# Patient Record
Sex: Male | Born: 1975
Health system: Southern US, Community
[De-identification: ages and names within clinical notes are randomized; demographics above are authoritative.]

---

## 2008-11-16 ENCOUNTER — Ambulatory Visit: Payer: Self-pay | Admitting: Internal Medicine

## 2008-11-16 DIAGNOSIS — L738 Other specified follicular disorders: Secondary | ICD-10-CM

## 2008-11-16 DIAGNOSIS — L219 Seborrheic dermatitis, unspecified: Secondary | ICD-10-CM | POA: Insufficient documentation

## 2015-02-07 ENCOUNTER — Emergency Department (HOSPITAL_COMMUNITY)
Admission: EM | Admit: 2015-02-07 | Discharge: 2015-02-07 | Disposition: A | Discharge status: Home / Self Care | Payer: BLUE CROSS/BLUE SHIELD | Attending: Emergency Medicine | Admitting: Emergency Medicine

## 2015-02-07 ENCOUNTER — Emergency Department (HOSPITAL_COMMUNITY): Payer: BLUE CROSS/BLUE SHIELD

## 2015-02-07 ENCOUNTER — Encounter (HOSPITAL_COMMUNITY): Payer: Self-pay | Admitting: Emergency Medicine

## 2015-02-07 DIAGNOSIS — J039 Acute tonsillitis, unspecified: Secondary | ICD-10-CM | POA: Insufficient documentation

## 2015-02-07 DIAGNOSIS — J029 Acute pharyngitis, unspecified: Secondary | ICD-10-CM | POA: Diagnosis present

## 2015-02-07 LAB — CBC WITH DIFFERENTIAL/PLATELET
Basophils Absolute: 0 10*3/uL (ref 0.0–0.1)
Basophils Relative: 0 %
EOS ABS: 0 10*3/uL (ref 0.0–0.7)
Eosinophils Relative: 0 %
HEMATOCRIT: 45 % (ref 39.0–52.0)
HEMOGLOBIN: 15.8 g/dL (ref 13.0–17.0)
LYMPHS ABS: 1.4 10*3/uL (ref 0.7–4.0)
LYMPHS PCT: 19 %
MCH: 29.8 pg (ref 26.0–34.0)
MCHC: 35.1 g/dL (ref 30.0–36.0)
MCV: 84.9 fL (ref 78.0–100.0)
MONOS PCT: 13 %
Monocytes Absolute: 1 10*3/uL (ref 0.1–1.0)
NEUTROS ABS: 5 10*3/uL (ref 1.7–7.7)
NEUTROS PCT: 68 %
Platelets: 233 10*3/uL (ref 150–400)
RBC: 5.3 MIL/uL (ref 4.22–5.81)
RDW: 11.9 % (ref 11.5–15.5)
WBC: 7.3 10*3/uL (ref 4.0–10.5)

## 2015-02-07 LAB — BASIC METABOLIC PANEL
ANION GAP: 13 (ref 5–15)
BUN: 10 mg/dL (ref 6–20)
CHLORIDE: 100 mmol/L — AB (ref 101–111)
CO2: 25 mmol/L (ref 22–32)
Calcium: 8.7 mg/dL — ABNORMAL LOW (ref 8.9–10.3)
Creatinine, Ser: 0.88 mg/dL (ref 0.61–1.24)
GFR calc non Af Amer: >60 mL/min (ref >60)
Glucose, Bld: 104 mg/dL — ABNORMAL HIGH (ref 65–99)
POTASSIUM: 3.6 mmol/L (ref 3.5–5.1)
SODIUM: 138 mmol/L (ref 135–145)

## 2015-02-07 MED ORDER — IOHEXOL 300 MG/ML  SOLN
75.0000 mL | Freq: Once | INTRAMUSCULAR | Status: AC | PRN
  Administered 2015-02-07: 75 mL via INTRAVENOUS

## 2015-02-07 MED ORDER — DEXAMETHASONE SODIUM PHOSPHATE 10 MG/ML IJ SOLN
10.0000 mg | Freq: Once | INTRAMUSCULAR | Status: DC
Start: 2015-02-07 — End: 2015-02-07

## 2015-02-07 MED ORDER — CLINDAMYCIN HCL 150 MG PO CAPS: 300.0000 mg | ORAL_CAPSULE | Freq: Four times a day (QID) | ORAL | Status: DC

## 2015-02-07 MED ORDER — DEXAMETHASONE SODIUM PHOSPHATE 10 MG/ML IJ SOLN
10.0000 mg | Freq: Once | INTRAMUSCULAR | Status: AC
  Administered 2015-02-07: 10 mg via INTRAVENOUS
  Filled 2015-02-07: qty 1

## 2015-02-07 NOTE — ED Provider Notes (Signed)
CSN: [id number]     Arrival date & time 02/07/15  1027 History   First MD Initiated Contact with Patient 02/07/15 0601     Chief Complaint  Patient presents with  . Fever  . Sore Throat     (Consider location/radiation/quality/duration/timing/severity/associated sxs/prior Treatment) HPI Comments: Pt is a 39 yo male who presents to the ED with complaint of sore throat, onset 6 days. Pt reports he was initially seen at [facility] on Wednesday for sore throat and had negative strep and was prescribed penicillin. He reports he had no relief and then was seen at the [facility] [address] [facility] yesterday, negative strep, negative mono and was prescribed a z-pak. He notes he has had no relief since starting the second antibiotics and reports worsening pain and swelling. Endorses fever that has been relieved at home with tylenol and ibuprofen. Denies trismus, drooling, neck swelling. Denies headache, chills, nasal congestion, rhinorrhea, SOB, dyspnea, CP, abdominal pain, N/V/D, urinary sxs, numbness, tingling, weakness. Pt notes his voice become more muffled this morning.   Patient is a 39 y.o. male presenting with fever and pharyngitis.  Fever Associated symptoms: sore throat   Sore Throat Associated symptoms include a fever and a sore throat.    History reviewed. No pertinent past medical history. History reviewed. No pertinent past surgical history. No family history on file. Social History  Substance Use Topics  . Smoking status: Never Smoker   . Smokeless tobacco: None  . Alcohol Use: Yes     Comment: social    Review of Systems  Constitutional: Positive for fever.  HENT: Positive for sore throat and voice change.       Allergies  Review of patient's allergies indicates no known allergies.  Home Medications   Prior to Admission medications   Medication Sig Start Date End Date Taking? Authorizing Provider  acetaminophen (TYLENOL) 500 MG tablet Take 1,000 mg by mouth every 6 (six) hours as needed  for moderate pain.   Yes Historical Provider, MD  azithromycin (ZITHROMAX) 250 MG tablet Take 250-500 mg by mouth daily. Take 2 tablets the first day Take 1 tablet daily for 4 days 5 days total   Yes Historical Provider, MD  ibuprofen (ADVIL,MOTRIN) 200 MG tablet Take 400 mg by mouth every 6 (six) hours as needed for moderate pain.   Yes Historical Provider, MD   BP 152/98 mmHg  Pulse 115  Temp(Src) 99 F (37.2 C) (Oral)  Wt 210 lb (95.255 kg)  SpO2 100% Physical Exam  Constitutional: He is oriented to person, place, and time. He appears well-developed and well-nourished. No distress.  HENT:  Head: Normocephalic and atraumatic.  Right Ear: Tympanic membrane normal.  Left Ear: Tympanic membrane normal.  Nose: Nose normal. Right sinus exhibits no maxillary sinus tenderness and no frontal sinus tenderness. Left sinus exhibits no maxillary sinus tenderness and no frontal sinus tenderness.  Mouth/Throat: Uvula is midline and mucous membranes are normal.  Bilateral swelling of tonsils with white/black exudate present on medial aspects of tonsils. Voice muffled during exam.  No trismus. No drooling.   Eyes: Conjunctivae and EOM are normal. Pupils are equal, round, and reactive to light. Right eye exhibits no discharge. Left eye exhibits no discharge. No scleral icterus.  Neck: Normal range of motion. Neck supple.  Mild swelling noted at submandibular region.   Cardiovascular: Normal rate, regular rhythm, normal heart sounds and intact distal pulses.   Pulmonary/Chest: Effort normal and breath sounds normal. No stridor. No respiratory distress. He has no  wheezes. He has no rales. He exhibits no tenderness.  Abdominal: Soft. Bowel sounds are normal. He exhibits no mass. There is no tenderness. There is no rebound and no guarding.  Musculoskeletal: He exhibits no edema.  Lymphadenopathy:    He has no cervical adenopathy.  Neurological: He is alert and oriented to person, place, and time.  Skin:  Skin is warm and dry.  Nursing note and vitals reviewed.   ED Course  Procedures (including critical care time) Labs Review Labs Reviewed - No data to display  Imaging Review No results found. I have personally reviewed and evaluated these images and lab results as part of my medical decision-making.   MDM   Final diagnoses:  Tonsillitis    Pt presents with worsening sore throat and resolved fever. No improvement with penicillin or azithromycin. VSS. Bilateral swelling or tonsils with exudate present on exam, no respiratory distress, no trismus, no drooling. CT neck revealed tonsillitis, right peritonsillar phlegmon or possibly early abscess, mild to moderate narrowing of the airway in the pharynx as a result, of concentric tonsillar hypertrophy, which is worse on the right. CBC and CMP unremarkable. Plan to d/c pt home with clindamycin and pt given ENT follow up. Advised to see ENT in the next 2-3 days. Pt given return precautions and advised to return to the ED if sxs worsen in the next 24 hours.   Evaluation does not show pathology requring ongoing emergent intervention or admission. Pt is hemodynamically stable and mentating appropriately. Discussed findings/results and plan with patient/guardian, who agrees with plan. All questions answered.      Satira Sark Kellogg, [place] 02/07/15 1626  Geoffery Lyons, MD 02/09/15 2138

## 2015-02-07 NOTE — ED Notes (Addendum)
MD Delo made aware of patient.

## 2015-02-07 NOTE — ED Notes (Addendum)
Pt from home c/o sore throat and fever since Tuesday. [facility] and had negative strep screen and was given penicillin. No relief. Pt went to [place] [facility] and was tested for strep once again and for mono and both were negative, he was prescribed a z pak and he has experienced no relief. Pt has been alternating tylenol and motrin. Last tylenol was 4 hours ago. Throat  Is swollen, red, and appears to resemble peritonsillar abcess.

## 2015-02-07 NOTE — Discharge Instructions (Signed)
Please take your prescriptions as prescribed. Please follow up with ENT in the next 2-3 days. Follow up with your primary care provider this week. Please return to the Emergency Department if symptoms worsen in the next 24 hours or new onset of difficulty swallowing, fever, drooling, neck swelling.

## 2015-09-14 DIAGNOSIS — M545 Low back pain: Secondary | ICD-10-CM | POA: Diagnosis not present

## 2015-09-27 DIAGNOSIS — M545 Low back pain: Secondary | ICD-10-CM | POA: Diagnosis not present

## 2016-05-04 DIAGNOSIS — J029 Acute pharyngitis, unspecified: Secondary | ICD-10-CM | POA: Diagnosis not present

## 2016-05-11 DIAGNOSIS — J069 Acute upper respiratory infection, unspecified: Secondary | ICD-10-CM | POA: Diagnosis not present

## 2016-05-11 DIAGNOSIS — J029 Acute pharyngitis, unspecified: Secondary | ICD-10-CM | POA: Diagnosis not present

## 2016-09-21 ENCOUNTER — Encounter: Payer: Self-pay | Admitting: Physician Assistant

## 2016-09-21 ENCOUNTER — Ambulatory Visit (INDEPENDENT_AMBULATORY_CARE_PROVIDER_SITE_OTHER): Payer: BC Managed Care – PPO | Admitting: Physician Assistant

## 2016-09-21 VITALS — BP 128/78 | HR 65 | Temp 99.0°F | Resp 16 | Ht 72.0 in | Wt 225.0 lb

## 2016-09-21 DIAGNOSIS — H109 Unspecified conjunctivitis: Secondary | ICD-10-CM | POA: Diagnosis not present

## 2016-09-21 MED ORDER — GENTAMICIN SULFATE 0.3 % OP SOLN: 1.0000 [drp] | OPHTHALMIC | 0 refills | Status: AC

## 2016-09-21 NOTE — Patient Instructions (Addendum)
     IF you received an x-ray today, you will receive an invoice from [facility]. Please contact [facility] at [phone] with questions or concerns regarding your invoice.   IF you received labwork today, you will receive an invoice from [facility]. Please contact [facility] at [phone] with questions or concerns regarding your invoice.   Our billing staff will not be able to assist you with questions regarding bills from these companies.  You will be contacted with the lab results as soon as they are available. The fastest way to get your results is to activate your My Chart account. Instructions are located on the last page of this paperwork. If you have not heard from us regarding the results in 2 weeks, please contact this office.     Bacterial Conjunctivitis Bacterial conjunctivitis is an infection of your conjunctiva. This is the clear membrane that covers the white part of your eye and the inner surface of your eyelid. This condition can make your eye:  Red or pink.  Itchy. This condition is caused by bacteria. This condition spreads very easily from person to person (is contagious) and from one eye to the other eye. Follow these instructions at home: Medicines   Take or apply your antibiotic medicine as told by your doctor. Do not stop taking or applying the antibiotic even if you start to feel better.  Take or apply over-the-counter and prescription medicines only as told by your doctor.  Do not touch your eyelid with the eye drop bottle or the ointment tube. Managing discomfort   Wipe any fluid from your eye with a warm, wet washcloth or a cotton ball.  Place a cool, clean washcloth on your eye. Do this for 10-20 minutes, 3-4 times per day. General instructions   Do not wear contact lenses until the irritation is gone. Wear glasses until your doctor says it is okay to wear contacts.  Do not wear eye makeup until your symptoms are gone. Throw away  any old makeup.  Change or wash your pillowcase every day.  Do not share towels or washcloths with anyone.  Wash your hands often with soap and water. Use paper towels to dry your hands.  Do not touch or rub your eyes.  Do not drive or use heavy machinery if your vision is blurry. Contact a doctor if:  You have a fever.  Your symptoms do not get better after 10 days. Get help right away if:  You have a fever and your symptoms suddenly get worse.  You have very bad pain when you move your eye.  Your face:  Hurts.  Is red.  Is swollen.  You have sudden loss of vision. This information is not intended to replace advice given to you by your health care provider. Make sure you discuss any questions you have with your health care provider. Document Released: 02/15/2008 Document Revised: 10/14/2015 Document Reviewed: 02/18/2015 Elsevier Interactive Patient Education  2017 Elsevier Inc.  

## 2016-09-21 NOTE — Progress Notes (Signed)
   Benjamin Cross  MRN: [id number] DOB: 11/10/1975  PCP: Sanda Lingerhomas Jones, MD  Chief Complaint  Patient presents with  . Conjunctivitis    Right eye redness/drainage     Subjective:  Pt presents to clinic for red right eye.  He has significant eye allergies in the past.  He has been having trouble with this eye and then last night and this am the right eye seemed to get worse different than normal with his allergies.  No recent cold  No sick contacts at home or work.    Review of Systems  Constitutional: Negative for chills and fever.  HENT: Negative.   Eyes: Positive for photophobia, discharge (eye matting), redness (right) and itching (prior). Negative for visual disturbance.  Allergic/Immunologic: Positive for environmental allergies.    Patient Active Problem List   Diagnosis Date Noted  . UNSPECIFIED SEBORRHEIC DERMATITIS 11/16/2008  . ASTEATOTIC ECZEMA 11/16/2008    No current outpatient prescriptions on file prior to visit.   No current facility-administered medications on file prior to visit.     No Known Allergies  Pt patients past, family and social history were reviewed and updated.   Objective:  BP 128/78   Pulse 65   Temp 99 F (37.2 C) (Oral)   Resp 16   Ht 6' (1.829 m)   Wt 225 lb (102.1 kg)   SpO2 96%   BMI 30.52 kg/m   Physical Exam  Constitutional: He is oriented to person, place, and time and well-developed, well-nourished, and in no distress.  HENT:  Head: Normocephalic and atraumatic.  Right Ear: External ear normal.  Left Ear: External ear normal.  Eyes: EOM are normal. Pupils are equal, round, and reactive to light. Right conjunctiva is injected. Left conjunctiva is not injected.  Sclera erythematous and swollen - no orbital erythema or swelling - no globe TTP  Neck: Normal range of motion.  Pulmonary/Chest: Effort normal.  Neurological: He is alert and oriented to person, place, and time. Gait normal.  Skin: Skin is warm and dry.    Psychiatric: Mood, memory, affect and judgment normal.    Assessment and Plan :  Bacterial conjunctivitis of right eye - Plan: gentamicin (GARAMYCIN) 0.3 % ophthalmic solution  Benny LennertSarah Adriana Lina PA-C  [facility] 09/21/2016 8:32 AM

## 2016-12-29 IMAGING — CT CT NECK W/ CM
4 of 5 series · 16 of 33 positions shown, 18 images · IV contrast (OMNIPAQUE 300)
Comparison: None.

CLINICAL DATA: Sore throat for 6 days, concern for peritonsillar
abscess, white blood count

EXAM:
CT NECK WITH CONTRAST
TECHNIQUE: Multidetector CT imaging of the neck was performed using the
standard protocol following the bolus administration of intravenous
contrast.
CONTRAST:  75mL OMNIPAQUE IOHEXOL 300 MG/ML  SOLN

[Series 3: neck with st · axial · 0.39mm/px · z∈[-272,-128]mm · 4 of 122 slices shown, 5 images]
[im 25/122  soft-tissue]
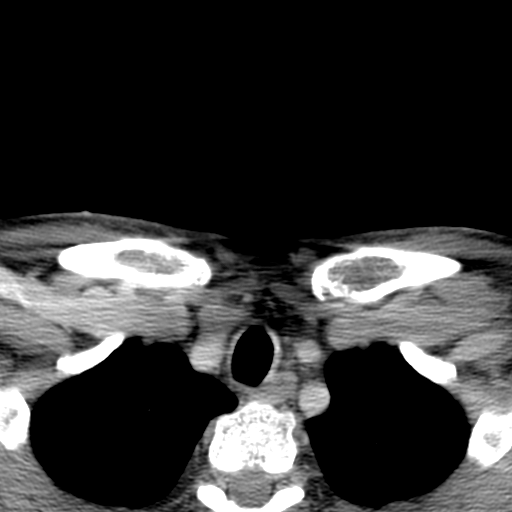
[im 25/122  bone]
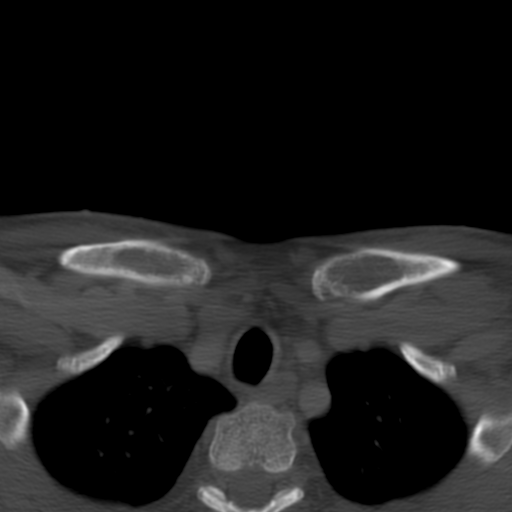
[im 49/122  bone]
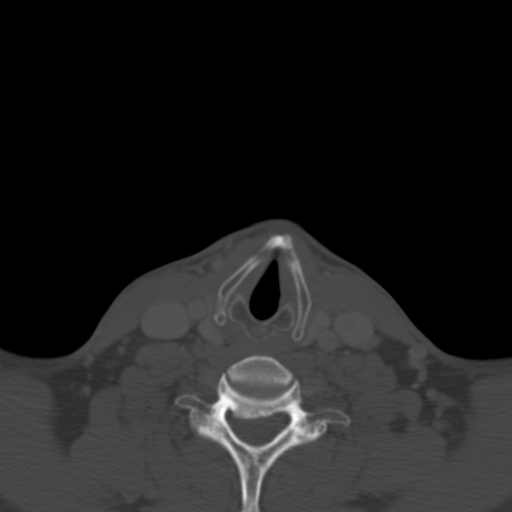
[im 73/122  bone]
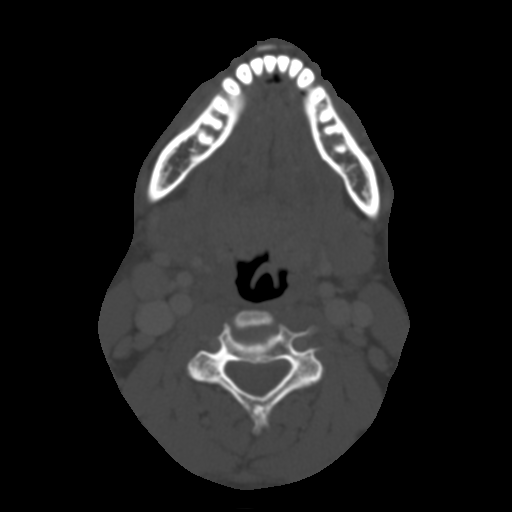
[im 97/122  bone]
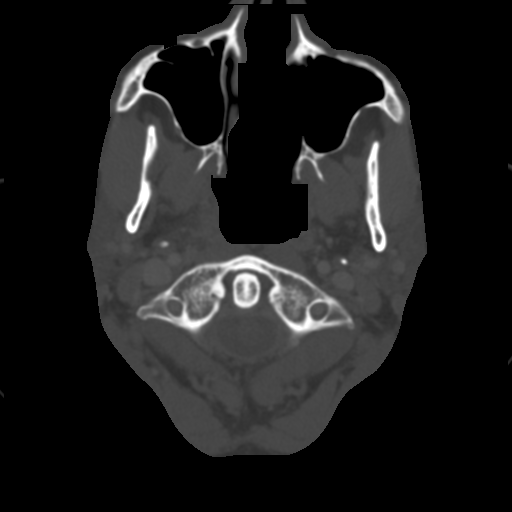

[Series 6: coronal st · coronal · 0.53mm/px · 3 of 96 slices shown]
[im 29/96  bone]
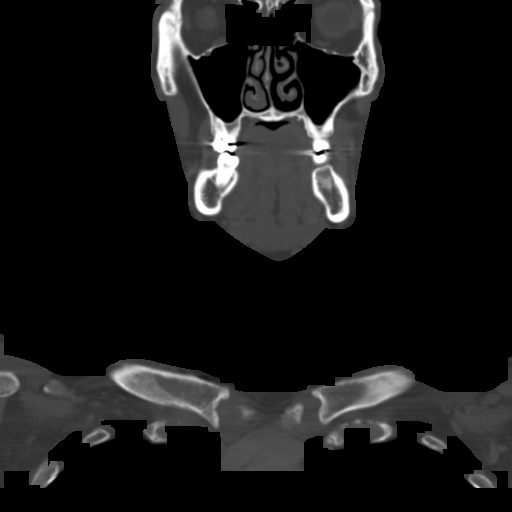
[im 42/96  bone]
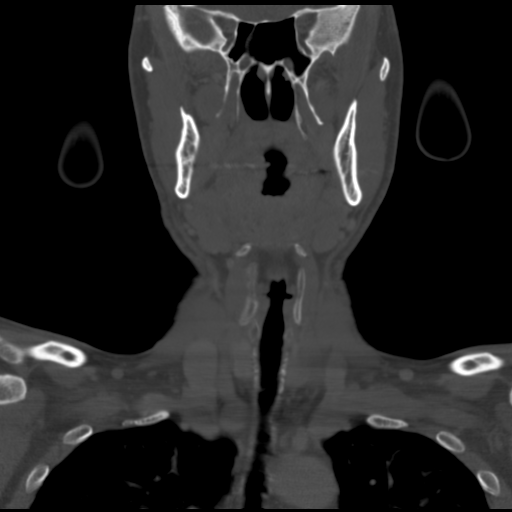
[im 54/96  bone]
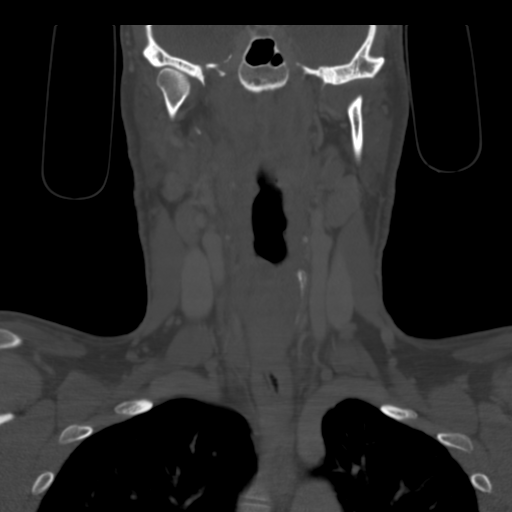

[Series 7: sagittal st · sagittal · 0.48mm/px · 5 of 101 slices shown, 6 images]
[im 34/101  bone]
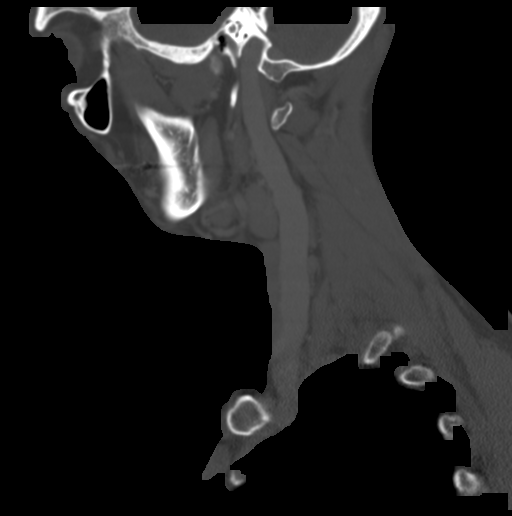
[im 42/101  bone]
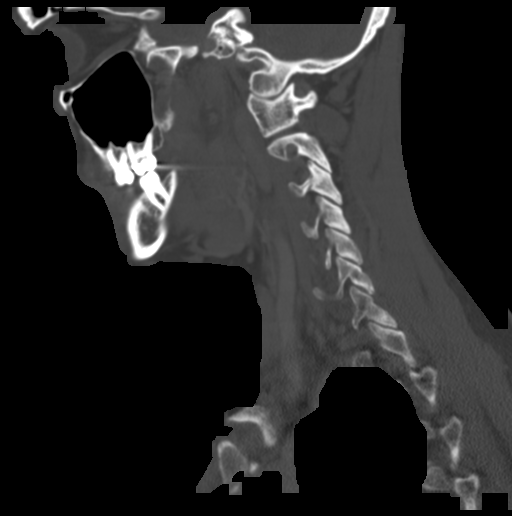
[im 51/101  soft-tissue]
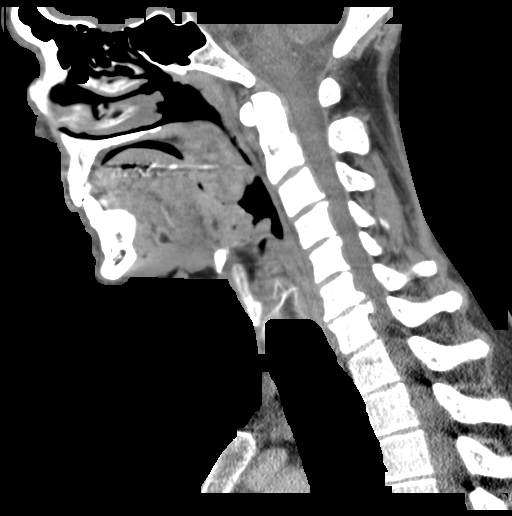
[im 51/101  bone]
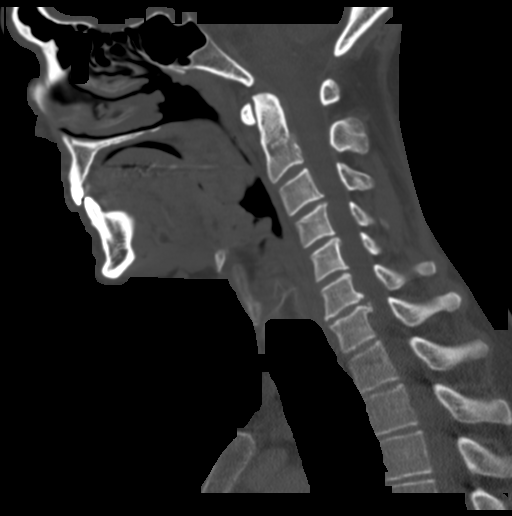
[im 59/101  bone]
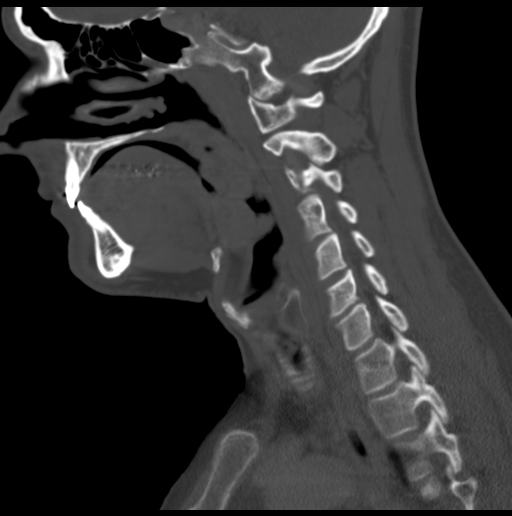
[im 67/101  bone]
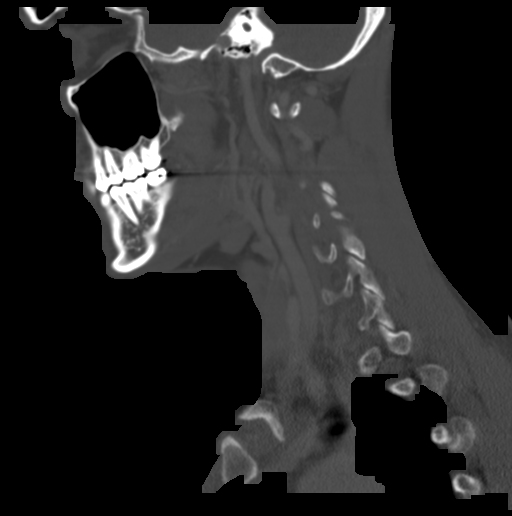

[Series 8: axial recons · axial · 0.39mm/px · z∈[-305,-157]mm · 4 of 134 slices shown]
[im 27/134  bone]
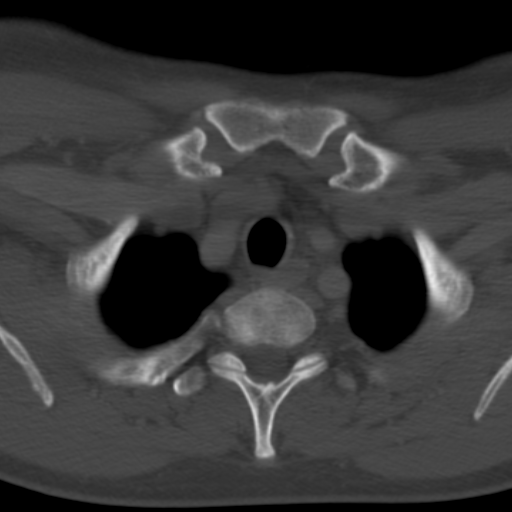
[im 54/134  bone]
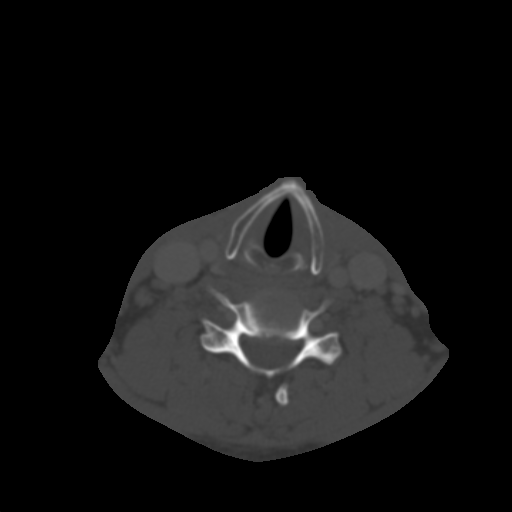
[im 80/134  bone]
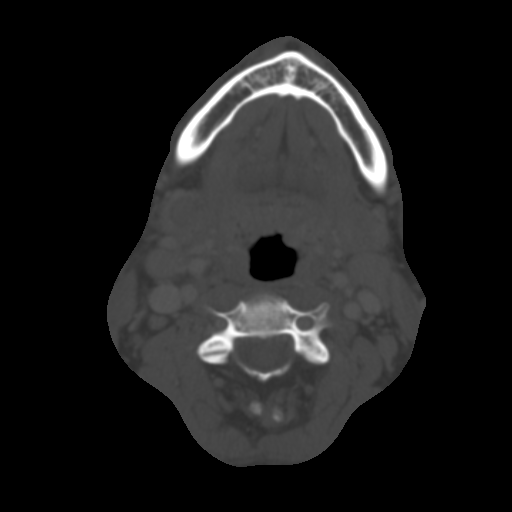
[im 107/134  bone]
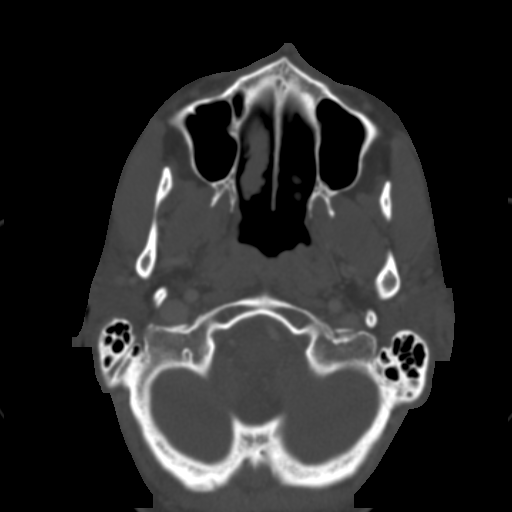

[16 of 33 positions shown; findings below may reference images not displayed]

FINDINGS: Pharynx and larynx: The parapharyngeal tonsillar tissue is
hypertrophy bilaterally. On the right, there is an area of subtle
low attenuation with subtle peripheral enhancement, measuring 18 x
11 mm. Soft tissue swelling is more severe on the right than on the
left and causes mass effect on the airway on the right side. There
is overall mild to moderate narrowing of the airway.

Salivary glands: Nor

Thyroid: Normal

Lymph nodes: Diffuse adenopathy throughout the neck presumably
reactive

Vascular: Normal

Limited intracranial: Normal

Visualized orbits: Normal

Mastoids and visualized paranasal sinuses: Normal

Skeleton: Normal

Upper chest: Normal
IMPRESSION: Tonsillitis. Right peritonsillar phlegmon or possibly early abscess.
Mild to moderate narrowing of the airway in the pharynx as a result
of concentric tonsillar hypertrophy, which is worse on the right.

## 2017-09-06 ENCOUNTER — Other Ambulatory Visit: Payer: Self-pay

## 2017-09-06 ENCOUNTER — Encounter: Payer: Self-pay | Admitting: Emergency Medicine

## 2017-09-06 ENCOUNTER — Ambulatory Visit (INDEPENDENT_AMBULATORY_CARE_PROVIDER_SITE_OTHER): Payer: BLUE CROSS/BLUE SHIELD | Admitting: Emergency Medicine

## 2017-09-06 VITALS — BP 114/71 | HR 55 | Temp 98.6°F | Resp 16 | Ht 71.25 in | Wt 194.4 lb

## 2017-09-06 DIAGNOSIS — J3489 Other specified disorders of nose and nasal sinuses: Secondary | ICD-10-CM | POA: Insufficient documentation

## 2017-09-06 DIAGNOSIS — R0981 Nasal congestion: Secondary | ICD-10-CM | POA: Diagnosis not present

## 2017-09-06 DIAGNOSIS — J0100 Acute maxillary sinusitis, unspecified: Secondary | ICD-10-CM | POA: Insufficient documentation

## 2017-09-06 MED ORDER — PSEUDOEPHEDRINE-GUAIFENESIN ER 60-600 MG PO TB12: 1.0000 | ORAL_TABLET | Freq: Two times a day (BID) | ORAL | 1 refills | Status: AC

## 2017-09-06 MED ORDER — TRIAMCINOLONE ACETONIDE 55 MCG/ACT NA AERO: 2.0000 | INHALATION_SPRAY | Freq: Every day | NASAL | 12 refills | Status: AC

## 2017-09-06 MED ORDER — AMOXICILLIN-POT CLAVULANATE 875-125 MG PO TABS: 1.0000 | ORAL_TABLET | Freq: Two times a day (BID) | ORAL | 0 refills | Status: AC

## 2017-09-06 NOTE — Progress Notes (Signed)
Benjamin Cross 42 y.o.   Chief Complaint  Patient presents with  . Nasal Congestion    x 2 weeks with chest congestion  . Sinus Problem    with pressure    HISTORY OF PRESENT ILLNESS: This is a 42 y.o. male complaining of sinus issues that started with a fever 2 weeks ago.  Now has nasal and chest congestion with sinus pressure and drainage.  No other significant symptoms.  Sinus Problem  This is a new problem. The current episode started 1 to 4 weeks ago. The problem has been gradually worsening since onset. The maximum temperature recorded prior to his arrival was 101 - 101.9 F. The fever has been present for 1 to 2 days. His pain is at a severity of 5/10. The pain is moderate. Associated symptoms include congestion, coughing and sinus pressure. Pertinent negatives include no chills, ear pain, headaches, neck pain, shortness of breath, sore throat or swollen glands. Past treatments include nothing.     Prior to Admission medications   Medication Sig Start Date End Date Taking? Authorizing Provider  Loratadine (CLARITIN PO) Take by mouth daily.   Yes [provider]    No Known Allergies  Patient Active Problem List   Diagnosis Date Noted  . UNSPECIFIED SEBORRHEIC DERMATITIS 11/16/2008  . ASTEATOTIC ECZEMA 11/16/2008    No past medical history on file.  No past surgical history on file.  Social History   Socioeconomic History  . Marital status: Married    Spouse name: Not on file  . Number of children: Not on file  . Years of education: Not on file  . Highest education level: Not on file  Occupational History  . Not on file  Social Needs  . Financial resource strain: Not on file  . Food insecurity:    Worry: Not on file    Inability: Not on file  . Transportation needs:    Medical: Not on file    Non-medical: Not on file  Tobacco Use  . Smoking status: Never Smoker  . Smokeless tobacco: Never Used  Substance and Sexual Activity  . Alcohol use:  Yes    Comment: social  . Drug use: Not on file  . Sexual activity: Not on file  Lifestyle  . Physical activity:    Days per week: Not on file    Minutes per session: Not on file  . Stress: Not on file  Relationships  . Social connections:    Talks on phone: Not on file    Gets together: Not on file    Attends religious service: Not on file    Active member of club or organization: Not on file    Attends meetings of clubs or organizations: Not on file    Relationship status: Not on file  . Intimate partner violence:    Fear of current or ex partner: Not on file    Emotionally abused: Not on file    Physically abused: Not on file    Forced sexual activity: Not on file  Other Topics Concern  . Not on file  Social History Narrative  . Not on file    No family history on file.   Review of Systems  Constitutional: Negative.  Negative for chills and fever.  HENT: Positive for congestion, sinus pressure and sinus pain. Negative for ear pain and sore throat.   Eyes: Negative for blurred vision and double vision.  Respiratory: Positive for cough. Negative for shortness of  breath.   Cardiovascular: Negative.  Negative for chest pain and palpitations.  Gastrointestinal: Negative.  Negative for abdominal pain, diarrhea, nausea and vomiting.  Genitourinary: Negative.  Negative for dysuria and hematuria.  Musculoskeletal: Negative for back pain, joint pain, myalgias and neck pain.  Skin: Negative.  Negative for rash.  Neurological: Negative for dizziness, sensory change, focal weakness and headaches.  Endo/Heme/Allergies: Negative.   All other systems reviewed and are negative.   Vitals:   09/06/17 0953  BP: 114/71  Pulse: (!) 55  Resp: 16  Temp: 98.6 F (37 C)  SpO2: 98%    Physical Exam  Constitutional: He is oriented to person, place, and time. He appears well-developed and well-nourished.  HENT:  Head: Normocephalic and atraumatic.  Right Ear: Tympanic membrane,  external ear and ear canal normal.  Left Ear: Tympanic membrane, external ear and ear canal normal.  Nose: Mucosal edema present. Left sinus exhibits maxillary sinus tenderness.  Eyes: Pupils are equal, round, and reactive to light. Conjunctivae and EOM are normal.  Neck: Normal range of motion. Neck supple. No thyromegaly present.  Cardiovascular: Normal rate, regular rhythm and normal heart sounds.  Pulmonary/Chest: Effort normal and breath sounds normal.  Abdominal: Soft. There is no tenderness.  Musculoskeletal: Normal range of motion.  Lymphadenopathy:    He has no cervical adenopathy.  Neurological: He is alert and oriented to person, place, and time. No sensory deficit. He exhibits normal muscle tone.  Skin: Skin is warm and dry. Capillary refill takes less than 2 seconds.  Psychiatric: He has a normal mood and affect. His behavior is normal.  Vitals reviewed.    ASSESSMENT & PLAN: Benjamin Cross was seen today for nasal congestion and sinus problem.  Diagnoses and all orders for this visit:  Acute non-recurrent maxillary sinusitis -     amoxicillin-clavulanate (AUGMENTIN) 875-125 MG tablet; Take 1 tablet by mouth 2 (two) times daily for 7 days.  Sinus pressure -     pseudoephedrine-guaifenesin (MUCINEX D) 60-600 MG 12 hr tablet; Take 1 tablet by mouth every 12 (twelve) hours for 5 days. -     triamcinolone (NASACORT) 55 MCG/ACT AERO nasal inhaler; Place 2 sprays into the nose daily.  Sinus congestion -     pseudoephedrine-guaifenesin (MUCINEX D) 60-600 MG 12 hr tablet; Take 1 tablet by mouth every 12 (twelve) hours for 5 days. -     triamcinolone (NASACORT) 55 MCG/ACT AERO nasal inhaler; Place 2 sprays into the nose daily.    Patient Instructions       IF you received an x-ray today, you will receive an invoice from [facility]. Please contact [facility] at [phone] with questions or concerns regarding your invoice.   IF you received labwork today,  you will receive an invoice from [facility]. Please contact [facility] at [phone] with questions or concerns regarding your invoice.   Our billing staff will not be able to assist you with questions regarding bills from these companies.  You will be contacted with the lab results as soon as they are available. The fastest way to get your results is to activate your My Chart account. Instructions are located on the last page of this paperwork. If you have not heard from us regarding the results in 2 weeks, please contact this office.     Sinusitis, Adult Sinusitis is soreness and inflammation of your sinuses. Sinuses are hollow spaces in the bones around your face. They are located:  Around your eyes.  In the middle of  your forehead.  Behind your nose.  In your cheekbones.  Your sinuses and nasal passages are lined with a stringy fluid (mucus). Mucus normally drains out of your sinuses. When your nasal tissues get inflamed or swollen, the mucus can get trapped or blocked so air cannot flow through your sinuses. This lets bacteria, viruses, and funguses grow, and that leads to infection. Follow these instructions at home: Medicines  Take, use, or apply over-the-counter and prescription medicines only as told by your doctor. These may include nasal sprays.  If you were prescribed an antibiotic medicine, take it as told by your doctor. Do not stop taking the antibiotic even if you start to feel better. Hydrate and Humidify  Drink enough water to keep your pee (urine) clear or pale yellow.  Use a cool mist humidifier to keep the humidity level in your home above 50%.  Breathe in steam for 10-15 minutes, 3-4 times a day or as told by your doctor. You can do this in the bathroom while a hot shower is running.  Try not to spend time in cool or dry air. Rest  Rest as much as possible.  Sleep with your head raised (elevated).  Make sure to get enough sleep each night. General  instructions  Put a warm, moist washcloth on your face 3-4 times a day or as told by your doctor. This will help with discomfort.  Wash your hands often with soap and water. If there is no soap and water, use hand sanitizer.  Do not smoke. Avoid being around people who are smoking (secondhand smoke).  Keep all follow-up visits as told by your doctor. This is important. Contact a doctor if:  You have a fever.  Your symptoms get worse.  Your symptoms do not get better within 10 days. Get help right away if:  You have a very bad headache.  You cannot stop throwing up (vomiting).  You have pain or swelling around your face or eyes.  You have trouble seeing.  You feel confused.  Your neck is stiff.  You have trouble breathing. This information is not intended to replace advice given to you by your health care provider. Make sure you discuss any questions you have with your health care provider. Document Released: 10/25/2007 Document Revised: 01/02/2016 Document Reviewed: 03/03/2015 Elsevier Interactive Patient Education  2018 Elsevier Inc.      Edwina Barth, MD [facility]

## 2017-09-06 NOTE — Patient Instructions (Addendum)
     IF you received an x-ray today, you will receive an invoice from [facility]. Please contact [facility] at [phone] with questions or concerns regarding your invoice.   IF you received labwork today, you will receive an invoice from [facility]. Please contact [facility] at [phone] with questions or concerns regarding your invoice.   Our billing staff will not be able to assist you with questions regarding bills from these companies.  You will be contacted with the lab results as soon as they are available. The fastest way to get your results is to activate your My Chart account. Instructions are located on the last page of this paperwork. If you have not heard from us regarding the results in 2 weeks, please contact this office.     Sinusitis, Adult Sinusitis is soreness and inflammation of your sinuses. Sinuses are hollow spaces in the bones around your face. They are located:  Around your eyes.  In the middle of your forehead.  Behind your nose.  In your cheekbones.  Your sinuses and nasal passages are lined with a stringy fluid (mucus). Mucus normally drains out of your sinuses. When your nasal tissues get inflamed or swollen, the mucus can get trapped or blocked so air cannot flow through your sinuses. This lets bacteria, viruses, and funguses grow, and that leads to infection. Follow these instructions at home: Medicines  Take, use, or apply over-the-counter and prescription medicines only as told by your doctor. These may include nasal sprays.  If you were prescribed an antibiotic medicine, take it as told by your doctor. Do not stop taking the antibiotic even if you start to feel better. Hydrate and Humidify  Drink enough water to keep your pee (urine) clear or pale yellow.  Use a cool mist humidifier to keep the humidity level in your home above 50%.  Breathe in steam for 10-15 minutes, 3-4 times a day or as told by your doctor. You can do  this in the bathroom while a hot shower is running.  Try not to spend time in cool or dry air. Rest  Rest as much as possible.  Sleep with your head raised (elevated).  Make sure to get enough sleep each night. General instructions  Put a warm, moist washcloth on your face 3-4 times a day or as told by your doctor. This will help with discomfort.  Wash your hands often with soap and water. If there is no soap and water, use hand sanitizer.  Do not smoke. Avoid being around people who are smoking (secondhand smoke).  Keep all follow-up visits as told by your doctor. This is important. Contact a doctor if:  You have a fever.  Your symptoms get worse.  Your symptoms do not get better within 10 days. Get help right away if:  You have a very bad headache.  You cannot stop throwing up (vomiting).  You have pain or swelling around your face or eyes.  You have trouble seeing.  You feel confused.  Your neck is stiff.  You have trouble breathing. This information is not intended to replace advice given to you by your health care provider. Make sure you discuss any questions you have with your health care provider. Document Released: 10/25/2007 Document Revised: 01/02/2016 Document Reviewed: 03/03/2015 Elsevier Interactive Patient Education  2018 [employer]  

## 2017-11-24 DIAGNOSIS — H1011 Acute atopic conjunctivitis, right eye: Secondary | ICD-10-CM | POA: Diagnosis not present

## 2018-02-12 DIAGNOSIS — M542 Cervicalgia: Secondary | ICD-10-CM | POA: Diagnosis not present

## 2018-03-14 DIAGNOSIS — M542 Cervicalgia: Secondary | ICD-10-CM | POA: Diagnosis not present

## 2018-03-14 DIAGNOSIS — M79601 Pain in right arm: Secondary | ICD-10-CM | POA: Diagnosis not present

## 2018-03-26 DIAGNOSIS — M542 Cervicalgia: Secondary | ICD-10-CM | POA: Diagnosis not present

## 2018-03-29 DIAGNOSIS — M542 Cervicalgia: Secondary | ICD-10-CM | POA: Diagnosis not present

## 2018-03-29 DIAGNOSIS — M50223 Other cervical disc displacement at C6-C7 level: Secondary | ICD-10-CM | POA: Diagnosis not present

## 2018-04-11 DIAGNOSIS — M5020 Other cervical disc displacement, unspecified cervical region: Secondary | ICD-10-CM | POA: Diagnosis not present

## 2018-04-30 DIAGNOSIS — M5412 Radiculopathy, cervical region: Secondary | ICD-10-CM | POA: Diagnosis not present

## 2018-05-06 DIAGNOSIS — M5412 Radiculopathy, cervical region: Secondary | ICD-10-CM | POA: Diagnosis not present

## 2019-05-02 ENCOUNTER — Other Ambulatory Visit: Payer: Self-pay

## 2019-05-02 DIAGNOSIS — Z20822 Contact with and (suspected) exposure to covid-19: Secondary | ICD-10-CM

## 2019-05-04 LAB — NOVEL CORONAVIRUS, NAA: SARS-CoV-2, NAA: NOT DETECTED

## 2020-01-19 DIAGNOSIS — Z20828 Contact with and (suspected) exposure to other viral communicable diseases: Secondary | ICD-10-CM | POA: Diagnosis not present

## 2020-09-03 DIAGNOSIS — Z20822 Contact with and (suspected) exposure to covid-19: Secondary | ICD-10-CM | POA: Diagnosis not present

## 2020-11-15 DIAGNOSIS — M5413 Radiculopathy, cervicothoracic region: Secondary | ICD-10-CM | POA: Diagnosis not present

## 2020-11-15 DIAGNOSIS — M9902 Segmental and somatic dysfunction of thoracic region: Secondary | ICD-10-CM | POA: Diagnosis not present

## 2020-11-15 DIAGNOSIS — M9901 Segmental and somatic dysfunction of cervical region: Secondary | ICD-10-CM | POA: Diagnosis not present

## 2020-11-15 DIAGNOSIS — M50223 Other cervical disc displacement at C6-C7 level: Secondary | ICD-10-CM | POA: Diagnosis not present

## 2020-11-18 DIAGNOSIS — M5413 Radiculopathy, cervicothoracic region: Secondary | ICD-10-CM | POA: Diagnosis not present

## 2020-11-18 DIAGNOSIS — M50223 Other cervical disc displacement at C6-C7 level: Secondary | ICD-10-CM | POA: Diagnosis not present

## 2020-11-18 DIAGNOSIS — M9902 Segmental and somatic dysfunction of thoracic region: Secondary | ICD-10-CM | POA: Diagnosis not present

## 2020-11-18 DIAGNOSIS — M9901 Segmental and somatic dysfunction of cervical region: Secondary | ICD-10-CM | POA: Diagnosis not present

## 2020-11-23 DIAGNOSIS — M5413 Radiculopathy, cervicothoracic region: Secondary | ICD-10-CM | POA: Diagnosis not present

## 2020-11-23 DIAGNOSIS — M9901 Segmental and somatic dysfunction of cervical region: Secondary | ICD-10-CM | POA: Diagnosis not present

## 2020-11-23 DIAGNOSIS — M50223 Other cervical disc displacement at C6-C7 level: Secondary | ICD-10-CM | POA: Diagnosis not present

## 2020-11-23 DIAGNOSIS — M9902 Segmental and somatic dysfunction of thoracic region: Secondary | ICD-10-CM | POA: Diagnosis not present

## 2020-11-25 DIAGNOSIS — M9901 Segmental and somatic dysfunction of cervical region: Secondary | ICD-10-CM | POA: Diagnosis not present

## 2020-11-25 DIAGNOSIS — M5413 Radiculopathy, cervicothoracic region: Secondary | ICD-10-CM | POA: Diagnosis not present

## 2020-11-25 DIAGNOSIS — M50223 Other cervical disc displacement at C6-C7 level: Secondary | ICD-10-CM | POA: Diagnosis not present

## 2020-11-25 DIAGNOSIS — M9902 Segmental and somatic dysfunction of thoracic region: Secondary | ICD-10-CM | POA: Diagnosis not present

## 2021-04-07 DIAGNOSIS — T148XXA Other injury of unspecified body region, initial encounter: Secondary | ICD-10-CM | POA: Diagnosis not present

## 2021-04-07 DIAGNOSIS — R21 Rash and other nonspecific skin eruption: Secondary | ICD-10-CM | POA: Diagnosis not present

## 2021-08-09 DIAGNOSIS — Z0000 Encounter for general adult medical examination without abnormal findings: Secondary | ICD-10-CM | POA: Diagnosis not present

## 2021-08-09 DIAGNOSIS — Z13220 Encounter for screening for lipoid disorders: Secondary | ICD-10-CM | POA: Diagnosis not present

## 2021-08-16 DIAGNOSIS — R059 Cough, unspecified: Secondary | ICD-10-CM | POA: Diagnosis not present

## 2021-08-16 DIAGNOSIS — Z20822 Contact with and (suspected) exposure to covid-19: Secondary | ICD-10-CM | POA: Diagnosis not present

## 2021-09-07 DIAGNOSIS — Z0000 Encounter for general adult medical examination without abnormal findings: Secondary | ICD-10-CM | POA: Diagnosis not present

## 2021-11-09 DIAGNOSIS — M7662 Achilles tendinitis, left leg: Secondary | ICD-10-CM | POA: Diagnosis not present

## 2022-02-05 ENCOUNTER — Encounter (HOSPITAL_BASED_OUTPATIENT_CLINIC_OR_DEPARTMENT_OTHER): Payer: Self-pay

## 2022-02-05 ENCOUNTER — Other Ambulatory Visit: Payer: Self-pay

## 2022-02-05 ENCOUNTER — Emergency Department (HOSPITAL_BASED_OUTPATIENT_CLINIC_OR_DEPARTMENT_OTHER)
Admission: EM | Admit: 2022-02-05 | Discharge: 2022-02-06 | Disposition: A | Discharge status: Home / Self Care | Payer: BLUE CROSS/BLUE SHIELD | Attending: Emergency Medicine | Admitting: Emergency Medicine

## 2022-02-05 DIAGNOSIS — S91201A Unspecified open wound of right great toe with damage to nail, initial encounter: Secondary | ICD-10-CM | POA: Diagnosis not present

## 2022-02-05 DIAGNOSIS — W228XXA Striking against or struck by other objects, initial encounter: Secondary | ICD-10-CM | POA: Insufficient documentation

## 2022-02-05 DIAGNOSIS — S99929A Unspecified injury of unspecified foot, initial encounter: Secondary | ICD-10-CM

## 2022-02-05 MED ORDER — LIDOCAINE-EPINEPHRINE (PF) 2 %-1:200000 IJ SOLN
20.0000 mL | Freq: Once | INTRAMUSCULAR | Status: AC
  Administered 2022-02-05: 20 mL
  Filled 2022-02-05: qty 20

## 2022-02-05 NOTE — Discharge Instructions (Signed)
Keep nailbed clean and dressed.  Keep protected.  It will take some time for your nail to regrow.

## 2022-02-05 NOTE — ED Provider Notes (Signed)
[facility] EMERGENCY DEPT Provider Note   CSN: [id number] Arrival date & time: 02/05/22  1946     History  Chief Complaint  Patient presents with   toenail injury    Benjamin Cross is a 46 y.o. male.  HPI     This is a 46 year old male who presents with an injury to the right great toe.  Patient reports that he was walking up the steps when he hit his right great toe on the steps causing the nail to avulsed off the nailbed.  This is happened to him once before.  He reports significant pain at the base of the toenail.  He has been ambulatory.  Has not taken anything for his pain.  Home Medications Prior to Admission medications   Medication Sig Start Date End Date Taking? Authorizing Provider  Loratadine (CLARITIN PO) Take by mouth daily.    [provider]  triamcinolone (NASACORT) 55 MCG/ACT AERO nasal inhaler Place 2 sprays into the nose daily. 09/06/17   Georgina Quint, MD      Allergies    Patient has no known allergies.    Review of Systems   Review of Systems  Musculoskeletal:        Toe nail injury  All other systems reviewed and are negative.   Physical Exam Updated Vital Signs BP 138/86   Pulse 70   Temp 98.3 F (36.8 C)   Resp 18   Ht 1.829 m (6')   Wt 102.1 kg   SpO2 94%   BMI 30.52 kg/m  Physical Exam Vitals and nursing note reviewed.  Constitutional:      Appearance: He is well-developed. He is not ill-appearing.  HENT:     Head: Normocephalic and atraumatic.  Cardiovascular:     Rate and Rhythm: Normal rate and regular rhythm.  Pulmonary:     Effort: Pulmonary effort is normal. No respiratory distress.  Musculoskeletal:     Cervical back: Neck supple.     Comments: Focused examination of the right foot with almost total avulsion of the great toenail, still attached at the base, no active bleeding  Lymphadenopathy:     Cervical: No cervical adenopathy.  Skin:    General: Skin is warm and dry.   Neurological:     Mental Status: He is alert and oriented to person, place, and time.  Psychiatric:        Mood and Affect: Mood normal.     ED Results / Procedures / Treatments   Labs (all labs ordered are listed, but only abnormal results are displayed) Labs Reviewed - No data to display  EKG None  Radiology No results found.  Procedures .Nail Removal  Date/Time: 02/06/2022 12:04 AM  Performed by: Shon Baton, MD Authorized by: Shon Baton, MD   Consent:    Consent obtained:  Verbal   Consent given by:  Patient   Risks, benefits, and alternatives were discussed: yes     Risks discussed:  Bleeding, incomplete removal, infection and pain Location:    Foot:  R big toe Pre-procedure details:    Skin preparation:  Povidone-iodine Anesthesia:    Anesthesia method:  Nerve block   Block location:  Base of great toe   Block needle gauge:  27 G   Block anesthetic:  Lidocaine 2% WITH epi   Block technique:  Ring block   Block injection procedure:  Anatomic landmarks identified and introduced needle   Block outcome:  Anesthesia achieved Nail  Removal:    Nail removed:  Complete   Nail bed repaired: no     Removed nail replaced and anchored: no   Trephination:    Subungual hematoma drained: no   Post-procedure details:    Dressing:  Petrolatum-impregnated gauze, antibiotic ointment and 4x4 sterile gauze   Procedure completion:  Tolerated     Medications Ordered in ED Medications  lidocaine-EPINEPHrine (XYLOCAINE W/EPI) 2 %-1:200000 (PF) injection 20 mL (20 mLs Infiltration Given by Other 02/05/22 2355)    ED Course/ Medical Decision Making/ A&P                           Medical Decision Making Risk Prescription drug management.   This patient presents to the ED for concern of toenail injury, this involves an extensive number of treatment options, and is a complaint that carries with it a high risk of complications and morbidity.  I considered the  following differential and admission for this acute, potentially life threatening condition.  The differential diagnosis includes full nail avulsion, matrix injury, less likely toe fracture  MDM:    This is a 46 year old male who presents with an injury to the right great toe.  He is nontoxic and vital signs are reassuring.  He has almost complete avulsion of the right great toe off the nailbed.  He was requesting for removal.  I offered the option of splinting at; however, he would like full removal.  He was numbed and toenail was removed without incident.  We discussed dressing changes and protection.  (Labs, imaging, consults)  Labs: I Ordered, and personally interpreted labs.  The pertinent results include: None  Imaging Studies ordered: I ordered imaging studies including none I independently visualized and interpreted imaging. I agree with the radiologist interpretation  Additional history obtained from chart review.  External records from outside source obtained and reviewed including prior evaluations  Cardiac Monitoring: The patient was maintained on a cardiac monitor.  I personally viewed and interpreted the cardiac monitored which showed an underlying rhythm of: Sinus rhythm  Reevaluation: After the interventions noted above, I reevaluated the patient and found that they have :improved  Social Determinants of Health: Lives independently  Disposition: Discharge  Co morbidities that complicate the patient evaluation History reviewed. No pertinent past medical history.   Medicines Meds ordered this encounter  Medications   lidocaine-EPINEPHrine (XYLOCAINE W/EPI) 2 %-1:200000 (PF) injection 20 mL    I have reviewed the patients home medicines and have made adjustments as needed  Problem List / ED Course: Problem List Items Addressed This Visit   None Visit Diagnoses     Injury of nail bed of toe    -  Primary                   Final Clinical  Impression(s) / ED Diagnoses Final diagnoses:  Injury of nail bed of toe    Rx / DC Orders ED Discharge Orders     None         Merryl Hacker, MD 02/06/22 0006

## 2022-02-05 NOTE — ED Triage Notes (Signed)
Pt injured toe on steps, toe nail almost completely off, bleeding controlled.

## 2022-02-05 NOTE — ED Provider Notes (Incomplete)
[facility] EMERGENCY DEPT Provider Note   CSN: [id number] Arrival date & time: 02/05/22  1946     History {Add pertinent medical, surgical, social history, OB history to HPI:1} Chief Complaint  Patient presents with  . toenail injury    Benjamin Cross is a 46 y.o. male.  HPI     This is a 46 year old male who presents with an injury to the right great toe.  Patient reports that he was walking up the steps when he hit his right great toe on the steps causing the nail to avulsed off the nailbed.  This is happened to him once before.  He reports significant pain at the base of the toenail.  He has been ambulatory.  Has not taken anything for his pain.  Home Medications Prior to Admission medications   Medication Sig Start Date End Date Taking? Authorizing Provider  Loratadine (CLARITIN PO) Take by mouth daily.    [provider]  triamcinolone (NASACORT) 55 MCG/ACT AERO nasal inhaler Place 2 sprays into the nose daily. 09/06/17   Georgina Quint, MD      Allergies    Patient has no known allergies.    Review of Systems   Review of Systems  Musculoskeletal:        Toe nail injury  All other systems reviewed and are negative.   Physical Exam Updated Vital Signs BP 138/86   Pulse 70   Temp 98.3 F (36.8 C)   Resp 18   Ht 1.829 m (6')   Wt 102.1 kg   SpO2 94%   BMI 30.52 kg/m  Physical Exam Vitals and nursing note reviewed.  Constitutional:      Appearance: He is well-developed. He is not ill-appearing.  HENT:     Head: Normocephalic and atraumatic.  Cardiovascular:     Rate and Rhythm: Normal rate and regular rhythm.  Pulmonary:     Effort: Pulmonary effort is normal. No respiratory distress.  Musculoskeletal:     Cervical back: Neck supple.     Comments: Focused examination of the right foot with almost total avulsion of the great toenail, still attached at the base, no active bleeding  Lymphadenopathy:     Cervical: No  cervical adenopathy.  Skin:    General: Skin is warm and dry.  Neurological:     Mental Status: He is alert and oriented to person, place, and time.  Psychiatric:        Mood and Affect: Mood normal.     ED Results / Procedures / Treatments   Labs (all labs ordered are listed, but only abnormal results are displayed) Labs Reviewed - No data to display  EKG None  Radiology No results found.  Procedures Procedures  {Document cardiac monitor, telemetry assessment procedure when appropriate:1}  Medications Ordered in ED Medications  lidocaine-EPINEPHrine (XYLOCAINE W/EPI) 2 %-1:200000 (PF) injection 20 mL (20 mLs Infiltration Given by Other 02/05/22 2355)    ED Course/ Medical Decision Making/ A&P                           Medical Decision Making Risk Prescription drug management.   ***  {Document critical care time when appropriate:1} {Document review of labs and clinical decision tools ie heart score, Chads2Vasc2 etc:1}  {Document your independent review of radiology images, and any outside records:1} {Document your discussion with family members, caretakers, and with consultants:1} {Document social determinants of health affecting pt's care:1} {Document  your decision making why or why not admission, treatments were needed:1} Final Clinical Impression(s) / ED Diagnoses Final diagnoses:  Injury of nail bed of toe    Rx / DC Orders ED Discharge Orders     None

## 2022-02-25 DIAGNOSIS — R051 Acute cough: Secondary | ICD-10-CM | POA: Diagnosis not present

## 2022-04-17 DIAGNOSIS — R0781 Pleurodynia: Secondary | ICD-10-CM | POA: Diagnosis not present

## 2022-09-08 DIAGNOSIS — Z6827 Body mass index (BMI) 27.0-27.9, adult: Secondary | ICD-10-CM | POA: Diagnosis not present

## 2022-09-08 DIAGNOSIS — J301 Allergic rhinitis due to pollen: Secondary | ICD-10-CM | POA: Diagnosis not present

## 2022-09-08 DIAGNOSIS — J029 Acute pharyngitis, unspecified: Secondary | ICD-10-CM | POA: Diagnosis not present

## 2022-09-13 DIAGNOSIS — Z0000 Encounter for general adult medical examination without abnormal findings: Secondary | ICD-10-CM | POA: Diagnosis not present

## 2022-09-13 DIAGNOSIS — E782 Mixed hyperlipidemia: Secondary | ICD-10-CM | POA: Diagnosis not present

## 2022-11-20 DIAGNOSIS — M222X2 Patellofemoral disorders, left knee: Secondary | ICD-10-CM | POA: Diagnosis not present

## 2022-11-20 DIAGNOSIS — M25521 Pain in right elbow: Secondary | ICD-10-CM | POA: Diagnosis not present

## 2023-01-30 DIAGNOSIS — M25521 Pain in right elbow: Secondary | ICD-10-CM | POA: Diagnosis not present

## 2023-01-30 DIAGNOSIS — M79672 Pain in left foot: Secondary | ICD-10-CM | POA: Diagnosis not present

## 2023-09-19 DIAGNOSIS — M19071 Primary osteoarthritis, right ankle and foot: Secondary | ICD-10-CM | POA: Diagnosis not present

## 2023-09-19 DIAGNOSIS — M792 Neuralgia and neuritis, unspecified: Secondary | ICD-10-CM | POA: Diagnosis not present

## 2024-02-04 DIAGNOSIS — Z6831 Body mass index (BMI) 31.0-31.9, adult: Secondary | ICD-10-CM | POA: Diagnosis not present

## 2024-02-04 DIAGNOSIS — E782 Mixed hyperlipidemia: Secondary | ICD-10-CM | POA: Diagnosis not present

## 2024-02-04 DIAGNOSIS — M79673 Pain in unspecified foot: Secondary | ICD-10-CM | POA: Diagnosis not present

## 2024-02-04 DIAGNOSIS — Z125 Encounter for screening for malignant neoplasm of prostate: Secondary | ICD-10-CM | POA: Diagnosis not present

## 2024-02-04 DIAGNOSIS — E663 Overweight: Secondary | ICD-10-CM | POA: Diagnosis not present

## 2024-02-04 DIAGNOSIS — Z0000 Encounter for general adult medical examination without abnormal findings: Secondary | ICD-10-CM | POA: Diagnosis not present

## 2024-02-29 DIAGNOSIS — M19072 Primary osteoarthritis, left ankle and foot: Secondary | ICD-10-CM | POA: Diagnosis not present

## 2024-02-29 DIAGNOSIS — M19071 Primary osteoarthritis, right ankle and foot: Secondary | ICD-10-CM | POA: Diagnosis not present

## 2024-02-29 DIAGNOSIS — M722 Plantar fascial fibromatosis: Secondary | ICD-10-CM | POA: Diagnosis not present

## 2024-02-29 DIAGNOSIS — M24572 Contracture, left ankle: Secondary | ICD-10-CM | POA: Diagnosis not present

## 2024-03-17 DIAGNOSIS — D123 Benign neoplasm of transverse colon: Secondary | ICD-10-CM | POA: Diagnosis not present

## 2024-03-17 DIAGNOSIS — K6289 Other specified diseases of anus and rectum: Secondary | ICD-10-CM | POA: Diagnosis not present

## 2024-03-17 DIAGNOSIS — Z1211 Encounter for screening for malignant neoplasm of colon: Secondary | ICD-10-CM | POA: Diagnosis not present

## 2024-03-27 DIAGNOSIS — M722 Plantar fascial fibromatosis: Secondary | ICD-10-CM | POA: Diagnosis not present

## 2024-04-24 DIAGNOSIS — M722 Plantar fascial fibromatosis: Secondary | ICD-10-CM | POA: Diagnosis not present
